# Patient Record
Sex: Male | Born: 1985 | Race: Black or African American | Hispanic: No | Marital: Single | Smoking: Current every day smoker
Health system: Southern US, Community
[De-identification: ages and names within clinical notes are randomized; demographics above are authoritative.]

## PROBLEM LIST (undated history)

## (undated) HISTORY — PX: FRACTURE SURGERY: SHX138

---

## 2011-08-13 ENCOUNTER — Encounter (HOSPITAL_BASED_OUTPATIENT_CLINIC_OR_DEPARTMENT_OTHER): Payer: Self-pay | Admitting: *Deleted

## 2011-08-13 ENCOUNTER — Emergency Department (HOSPITAL_BASED_OUTPATIENT_CLINIC_OR_DEPARTMENT_OTHER)
Admission: EM | Admit: 2011-08-13 | Discharge: 2011-08-13 | Disposition: A | Discharge status: Home / Self Care | Payer: Self-pay | Attending: Emergency Medicine | Admitting: Emergency Medicine

## 2011-08-13 DIAGNOSIS — A630 Anogenital (venereal) warts: Secondary | ICD-10-CM | POA: Insufficient documentation

## 2011-08-13 DIAGNOSIS — A510 Primary genital syphilis: Secondary | ICD-10-CM | POA: Insufficient documentation

## 2011-08-13 DIAGNOSIS — F17200 Nicotine dependence, unspecified, uncomplicated: Secondary | ICD-10-CM | POA: Insufficient documentation

## 2011-08-13 MED ORDER — CEFTRIAXONE SODIUM 250 MG IJ SOLR
250.0000 mg | Freq: Once | INTRAMUSCULAR | Status: AC
  Administered 2011-08-13: 250 mg via INTRAMUSCULAR
  Filled 2011-08-13: qty 250

## 2011-08-13 MED ORDER — AZITHROMYCIN 250 MG PO TABS
1000.0000 mg | ORAL_TABLET | Freq: Once | ORAL | Status: AC
  Administered 2011-08-13: 1000 mg via ORAL
  Filled 2011-08-13: qty 1
  Filled 2011-08-13: qty 4

## 2011-08-13 MED ORDER — LIDOCAINE HCL (PF) 1 % IJ SOLN
INTRAMUSCULAR | Status: AC
  Administered 2011-08-13: 5 mL
  Filled 2011-08-13: qty 5

## 2011-08-13 MED ORDER — PENICILLIN G BENZATHINE 1200000 UNIT/2ML IM SUSP
2.4000 10*6.[IU] | Freq: Once | INTRAMUSCULAR | Status: AC
  Administered 2011-08-13: 2.4 10*6.[IU] via INTRAMUSCULAR
  Filled 2011-08-13 (×2): qty 2

## 2011-08-13 MED ORDER — METRONIDAZOLE 500 MG PO TABS
2000.0000 mg | ORAL_TABLET | Freq: Once | ORAL | Status: AC
  Administered 2011-08-13: 2000 mg via ORAL
  Filled 2011-08-13: qty 4

## 2011-08-13 NOTE — ED Notes (Signed)
Pt states he has a ? Open cut, discharge and itching to the penis x a couple days

## 2011-08-13 NOTE — ED Provider Notes (Signed)
History  This chart was scribed for Troy Numbers, MD by Cherlynn Perches. The patient was seen in room MH06/MH06. Patient's care was started at 1513.  CSN: [id number]  Arrival date & time 08/13/11  1513   First MD Initiated Contact with Patient 08/13/11 1551      Chief Complaint  Patient presents with  . Penile Discharge    (Consider location/radiation/quality/duration/timing/severity/associated sxs/prior treatment) HPI  Troy Pineda is a 26 y.o. male who presents to the Emergency Department complaining of 2-3 days gradually worsening, constant, moderate genital sores characterized by a "cut" and "a few bumps" on his penis with associated discharge from sores and itching around the bumps. Pt states that he was tested for STDs a week and a half ago, but has not received the results yet. Pt denies having unprotected sex since being tested. Pt denies nausea, vomiting, fever, dysuria, and penile pain. Pt is a current everyday smoker and reports alcohol use.   History reviewed. No pertinent past medical history.  Past Surgical History  Procedure Date  . Fracture surgery     History reviewed. No pertinent family history.  History  Substance Use Topics  . Smoking status: Current Everyday Smoker  . Smokeless tobacco: Not on file  . Alcohol Use: Yes      Review of Systems  Constitutional: Negative for fever and chills.  HENT: Negative for ear pain and neck pain.   Respiratory: Negative for cough and shortness of breath.   Cardiovascular: Negative for chest pain.  Gastrointestinal: Negative for nausea, vomiting, diarrhea and constipation.  Genitourinary: Positive for discharge and genital sores. Negative for dysuria, hematuria and penile pain.  Neurological: Negative for tremors and headaches.  All other systems reviewed and are negative.    Allergies  Review of patient's allergies indicates not on file.  Home Medications  No current outpatient prescriptions on  file.  Triage Vitals: BP 119/64  Pulse 72  Temp(Src) 98.1 F (36.7 C) (Oral)  Resp 16  Ht 5\' 10"  (1.778 m)  Wt 155 lb (70.308 kg)  BMI 22.24 kg/m2  SpO2 100%  Physical Exam  Nursing note and vitals reviewed. Constitutional: He is oriented to person, place, and time. He appears well-developed and well-nourished.  HENT:  Head: Normocephalic and atraumatic.  Eyes: Conjunctivae are normal. No scleral icterus.  Neck: Normal range of motion. Neck supple.  Cardiovascular: Normal rate and regular rhythm.   Pulmonary/Chest: Effort normal and breath sounds normal.  Abdominal: Soft. There is no tenderness.  Genitourinary:       Patient is a circumcised male. He has numerous lesions consistent with genital warts noted over the dorsum of the penis as well as over the right scrotum. Patient also has an area at the 10:00 position at the base of the penis that is a centimeter in diameter, also rated, and relatively painless considering appearance. This is very concerning for a possible chancre there are also a few small and possibly vesicular lesions over the dorsum of the penis but these are nontender.  Musculoskeletal: Normal range of motion. He exhibits no edema.  Neurological: He is alert and oriented to person, place, and time.  Skin: Skin is warm and dry.  Psychiatric: He has a normal mood and affect. His behavior is normal.    ED Course  Procedures (including critical care time)  DIAGNOSTIC STUDIES: Oxygen Saturation is 100% on room air, normal by my interpretation.    COORDINATION OF CARE: 4:10PM - Will prescribe Rocephin, Flagyl, and send  for RPR. Pt agrees with plan. 5:50PM - Will discharge pt with prescriptions for rocephin, azithromycin, flagyl, bicillin 2.4 million units for early syphilis. RPR sent. Pt advised to follow up on STD results from the health department. Pt agrees with plan.     Labs Reviewed  RPR   No results found.   1. Chancre, syphilitic   2. Genital  warts       MDM  Patient was evaluated by myself. Based on presentation patient and I discussed that I was very concerned about him having numerous STDs including syphilis as well as genital warts. Patient was just tested at health department a week ago and has not yet received his testing results. Patient denies any penile discharge. He does have lesions that are very concerning for syphilis. Given this patient was treated him. He was Rocephin, azithromycin, and Flagyl for gonorrhea, Chlamydia, and Trichomonas. An RPR was sent to the patient reports that he had swabs of his mouth as well as blood testing performed. He did not have the lesions that he has today when he presented at that time. Patient was also treated with bicillin 2.4 million units IM.  He was told he should follow-up with an STD clinic or a regular doctor for possible cryotherapy for his lesions concerning for genital warts. The patient had a few very small vesicular appearing lesions these were nontender. Patient was given information for followup with infectious disease clinic as well as the STD clinic. Patient was what he was treated for today. He was advised to notify all his partners and to behave is infected with sexual transmitted disease. No additional testing was performed today as patient still has testing pending in another location.     I personally performed the services described in this documentation, which was scribed in my presence. The recorded information has been reviewed and considered.      Troy Numbers, MD 08/13/11 [phone]

## 2011-08-13 NOTE — Discharge Instructions (Signed)
You were treated for gonorrhea, chlamydia, and trichomonas as well as primary syphillis today.  You were tested again for syphillis.  Please notify your sexual partners and follow-up with a regular doctor regarding your genital warts. Syphilis, Females and Males Syphilis is an infection. Syphilis can be treated with medicines that kill germs (antibiotics). It is necessary that all your sexual partners also be tested for infection and possibly be treated. CAUSES  Syphilis is caused by a germ (bacteria) called Treponema pallidum. This infection is most commonly spread by sexual contact. The bacteria from an infected person is passed to another person through minor cuts or scrapes in the skin or mucous membranes. This infection may also be passed to a fetus through the blood of the pregnant and infected mother. This is very serious. It can cause deformities and may be life-threatening to the newborn. SYMPTOMS  Syphilis has 3 symptom stages:  Primary syphilis. The first stage involves painless sores (chancres) in and around the genital organs and mouth. Lymph nodes near the sores may also be swollen. Both the sores and swollen lymph nodes can disappear even if you are not treated. However, you will still have the infection in your body and can pass it to another person.   Secondary syphilis. The second stage involves a rash or sores over any portion of the body, including the palms of the hands and soles of the feet. Other symptoms commonly occur, including fever, new sores in the mouth or on the genitals, feeling generally ill, and having pain in the joints. These symptoms can also disappear with no treatment. However, you will still have the infection and can pass it to another person.   Tertiary syphilis. The third stage of this illness involves major and severe damage to different organs in the body. This includes damage to the brain leading to dementia and damage to the spinal cord leading to difficulty  walking. It also includes damage to the heart and aorta. This may cause heart failure, fainting, or development of an enlargement (aneurysm) of the aorta.  At any time after infection, it is possible to have no symptoms or signs of this disease. This is called latent syphilis. The only way to prove the presence of latent syphilis is by testing your blood for the infection. DIAGNOSIS  Depending on the stage of illness, an exam may show nothing or it may show the various symptoms associated with each stage.  During all stages of this infection, blood tests are used to confirm the diagnosis.   In the first and second stages, a fluid (drainage) sample from a sore or rash can be examined under a microscope to detect the bacteria.   Sometimes, fluid around the spine needs to be examined to detect brain damage or inflammation of the brain lining (meningitis).   In the third stage, X-rays, computerized X-ray scans (CT or CAT scans), computerized magnetic scans (MRIs), or ultrasounds may also be done to detect disease of the heart, aorta, or brain.  TREATMENT   Antibiotics are used to treat all stages of syphilis. During the first day of your treatment you may experience fever, chills, headache, nausea, or aching all over your body. This is a normal reaction to the medicine.   Syphilis can be cured if the diagnosis is made early and the correct antibiotic is given.   Notify your recent sexual partners so they will go for testing as well as possible treatment.   Do not engage in sexual  activity until your caregiver tells you the infection has been cured.   Testing and treatment for other sexually transmitted diseases (STDs) may be done when you are diagnosed with syphilis. Syphilis is an STD. You are also at risk for other STDs, which are often transmitted around the same time as syphilis. These include:   Chlamydia.   Gonorrhea.   Trichomonas.   Human papilloma virus (HPV).   Human  immunodeficiency virus (HIV).   If you are pregnant, have syphilis, and are not treated, it can cause deformities and can be life-threatening to the baby.  HOME CARE INSTRUCTIONS   Finish all medicine as prescribed. Incomplete treatment will put you at risk for continued infection and could be life-threatening.   Only take over-the-counter or prescription medicines for pain, discomfort, or fever as directed by your caregiver.   Do not have sex until treatment is completed, or as instructed by your caregiver.   Follow up with your caregiver as directed.   If your diagnosis of syphilis is confirmed, inform your recent sexual partners. They need to seek care and treatment, even if they have no symptoms.  Finding out the results of your test Not all test results are available during your visit. If your test results are not back during the visit, make an appointment with your caregiver to find out the results. Do not assume everything is normal if you have not heard from your caregiver or the medical facility. It is important for you to follow up on all of your test results. SEEK MEDICAL CARE IF:   You develop any bad reaction to the medicine you were prescribed. This may include:   An oral temperature above 102 F (38.9 C).   Chills.   Headache.   Feeling lightheaded.   A new rash.  MAKE SURE YOU:   Understand these instructions.   Will watch your condition.   Will get help right away if you are not doing well or get worse.  Document Released: 08/17/2000 Document Revised: 02/10/2011 Document Reviewed: 06/24/2009 [facility] Patient Information 2012 [place], [place].Genital Warts Genital warts are a sexually transmitted infection. They may appear as small bumps on the tissues of the genital area. CAUSES  Genital warts are caused by a virus called human papillomavirus (HPV). HPV is the most common sexually transmitted disease (STD) and infection of the sex organs. This infection is  spread by having unprotected sex with an infected person. It can be spread by vaginal, anal, and oral sex. Many people do not know they are infected. They may be infected for years without problems. However, even if they do not have problems, they can unknowingly pass the infection to their sexual partners. SYMPTOMS   Itching and irritation in the genital area.   Warts that bleed.   Painful sexual intercourse.  DIAGNOSIS  Warts are usually recognized with the naked eye on the vagina, vulva, perineum, anus, and rectum. Certain tests can also diagnose genital warts, such as:  A Pap test.   A tissue sample (biopsy) exam.   Colposcopy. A magnifying tool is used to examine the vagina and cervix. The HPV cells will change color when certain solutions are used.  TREATMENT  Warts can be removed by:  Applying certain chemicals, such as cantharidin or podophyllin.   Liquid nitrogen freezing (cryotherapy).   Immunotherapy with candida or trichophyton injections.   Laser treatment.   Burning with an electrified probe (electrocautery).   Interferon injections.   Surgery.  PREVENTION  HPV vaccination can help prevent HPV infections that cause genital warts and that cause cancer of the cervix. It is recommended that the vaccination be given to people between the ages 41 to 72 years old. The vaccine might not work as well or might not work at all if you already have HPV. It should not be given to pregnant women. HOME CARE INSTRUCTIONS   It is important to follow your caregiver's instructions. The warts will not go away without treatment. Repeat treatments are often needed to get rid of warts. Even after it appears that the warts are gone, the normal tissue underneath often remains infected.   Do not try to treat genital warts with medicine used to treat hand warts. This type of medicine is strong and can burn the skin in the genital area, causing more damage.   Tell your past and current  sexual partner(s) that you have genital warts. They may be infected also and need treatment.   Avoid sexual contact while being treated.   Do not touch or scratch the warts. The infection may spread to other parts of your body.   Women with genital warts should have a cervical cancer check (Pap test) at least once a year. This type of cancer is slow-growing and can be cured if found early. Chances of developing cervical cancer are increased with HPV.   Inform your obstetrician about your warts in the event of pregnancy. This virus can be passed to the baby's respiratory tract. Discuss this with your caregiver.   Use a condom during sexual intercourse. Following treatment, the use of condoms will help prevent reinfection.   Ask your caregiver about using over-the-counter anti-itch creams.  SEEK MEDICAL CARE IF:   Your treated skin becomes red, swollen, or painful.   You have a fever.   You feel generally ill.   You feel little lumps in and around your genital area.   You are bleeding or have painful sexual intercourse.  MAKE SURE YOU:   Understand these instructions.   Will watch your condition.   Will get help right away if you are not doing well or get worse.  Document Released: 02/19/2000 Document Revised: 02/10/2011 Document Reviewed: 08/30/2010 [facility] Patient Information 2012 [place], [place].  RESOURCE GUIDE  Chronic Pain Problems: Contact [facility]  [phone] Patients need to be referred by their primary care doctor.  Insufficient Money for Medicine: Contact United Way:  call "211" or [facility] [phone].  No Primary Care Doctor: - Call [facility]  [phone] - can help you locate a primary care doctor that  accepts your insurance, provides certain services, etc. - Physician Referral [phone]  Agencies that provide inexpensive medical care: - [facility]  [phone] - [facility]   [phone] - [facility]  [phone] [facility]  [phone] - Planned Parenthood  [phone] [facility]  [phone]  Medicaid-accepting [facility] Providers: - [facility]- [address]  [phone], Mon-Fri 9am-7pm, Sat 9am-1pm - [facility]- [address] [place], [address] [place]  [phone] - [facility]- [address] [place]  [phone] [facility]- [address]  [phone] - Renaye Rakers- [address] [place], [address], [phone]  Only accepts [place] patients after they have their name  applied to their card  Self Pay (no insurance) in [place]: - Sickle Cell Patients: Dr Willey Blade, [facility]  [address] [place], [phone] - [facility]- [address] [place]  [phone]       Patrcia Dolly [facility] [place]- [address]       -     [facility]- see information above (Speak to [employer] if you do not have insurance)       -  [facility]- [address] [place], [phone]       -  [facility] [place]- [address],  [phone]       -  [facility]- [address] [phone]       -  Dr Julio Sicks-  [address], [place], [phone]       -  [facility]- [address], [phone]       -  [facility]- [address], [phone], also [address] [phone]       -    [facility]- [address] [place], [phone], 1st & 3rd Saturday   every month, 10am-1pm  1) Find a Doctor and Pay Out of Pocket Although you won't have to find out who is covered by your insurance plan, it is a good idea to ask around and get recommendations. You will then need to call the office and see if the doctor you have chosen will accept you as a new patient and what types of options they offer for patients who are self-pay.  Some doctors offer discounts or will set up payment plans for their patients who do not have insurance, but you will need to ask so you aren't surprised when you get to your appointment.  2) Contact Your Local Health Department Not all health departments have doctors that can see patients for sick visits, but many do, so it is worth a call to see if yours does. If you don't know where your local health department is, you can check in your phone book. The CDC also has a tool to help you locate your state's health department, and many state websites also have listings of all of their local health departments.  3) Find a Walk-in Clinic If your illness is not likely to be very severe or complicated, you may want to try a walk in clinic. These are popping up all over the country in pharmacies, drugstores, and shopping centers. They're usually staffed by nurse practitioners or physician assistants that have been trained to treat common illnesses and complaints. They're usually fairly quick and inexpensive. However, if you have serious medical issues or chronic medical problems, these are probably not your best option  STD Testing - [facility] Department of [facility], STD Clinic, [address] [place], phone [phone] or [phone].  Monday - Friday, call for an appointment. [facility] Department of [employer], STD Clinic, [address], [place], phone [phone] or [phone].  Monday - Friday, call for an appointment.  Abuse/Neglect: [facility] Child Abuse Hotline 805-683-3396 [facility] Child Abuse Hotline 8575552691 (After Hours)  Emergency Shelter:  [facility] 530-690-0404  Maternity Homes: - Room at the [place] [facility] 715-861-9858 - [facility] 9090288591  MRSA Hotline #:   (670)851-6887  [facility]  [place]  [employer] [facility] Dept. [address]                 [address]         [address] [place] [address]  Blondell Reveal Phone:  [phone]                                  Phone:  [phone]                   Phone:  [phone]  [facility] Mental Health, [phone] - [phone]       -     [facility] in [place], [address]                                  [phone], [facility] Child Abuse Hotline 714-277-6070 or 579-180-4261 (After Hours)   Behavioral Health Services  Substance Abuse Resources: - Alcohol and Drug Services  (254) 084-2526 - [facility] (585)686-5497 - [facility] [place] [phone] Floydene Flock (858)276-2498 - Residential & Outpatient Substance Abuse Program  (571)262-6776  Psychological Services: [facility]  2516757240 Services  [phone] - [facility], ([phone] [place]. [address], [place], ACCESS LINE: 814-632-2554 or 5874409537, [employer]za  Dental Assistance  If unable to pay or uninsured, contact:  [facility] or [facility] to become qualified for the adult dental clinic.  Patients with Medicaid: [facility] [phone] [address], [phone] [address], [phone]  If unable to pay, or uninsured, contact [facility] [phone]) or [facility] Department [phone] in [place], [phone] in [facility]) to become qualified for the adult dental clinic  Other Low-Cost Community Dental Services: - [facility]- [address] [place], [place], [place], [address], [phone], [address]. [phone], 2nd and 4th Thursday of the month at 6:30am.  10 clients each day by appointment, can sometimes see walk-in patients if someone does not show for  an appointment. [facility]- [address] [place], [place], [address], [phone] - [facility]- [address], [place], [place], [address], [phone] - [facility]- [phone] [facility] Health Department- [phone] [facility] [phone] -

## 2011-08-14 LAB — RPR: RPR Ser Ql: NONREACTIVE

## 2014-05-16 ENCOUNTER — Emergency Department (HOSPITAL_BASED_OUTPATIENT_CLINIC_OR_DEPARTMENT_OTHER)
Admission: EM | Admit: 2014-05-16 | Discharge: 2014-05-16 | Disposition: A | Discharge status: Home / Self Care | Payer: Self-pay | Attending: Emergency Medicine | Admitting: Emergency Medicine

## 2014-05-16 ENCOUNTER — Encounter (HOSPITAL_BASED_OUTPATIENT_CLINIC_OR_DEPARTMENT_OTHER): Payer: Self-pay | Admitting: *Deleted

## 2014-05-16 DIAGNOSIS — R69 Illness, unspecified: Secondary | ICD-10-CM

## 2014-05-16 DIAGNOSIS — Z7982 Long term (current) use of aspirin: Secondary | ICD-10-CM | POA: Insufficient documentation

## 2014-05-16 DIAGNOSIS — Z720 Tobacco use: Secondary | ICD-10-CM | POA: Insufficient documentation

## 2014-05-16 DIAGNOSIS — J111 Influenza due to unidentified influenza virus with other respiratory manifestations: Secondary | ICD-10-CM | POA: Insufficient documentation

## 2014-05-16 MED ORDER — OSELTAMIVIR PHOSPHATE 75 MG PO CAPS: 75.0000 mg | ORAL_CAPSULE | Freq: Two times a day (BID) | ORAL | Status: AC

## 2014-05-16 MED ORDER — ACETAMINOPHEN 325 MG PO TABS
650.0000 mg | ORAL_TABLET | Freq: Once | ORAL | Status: AC
  Administered 2014-05-16: 650 mg via ORAL
  Filled 2014-05-16: qty 2

## 2014-05-16 NOTE — ED Notes (Signed)
symptom began last night. Daughter diagnosed with flu this am at PCP.

## 2014-05-16 NOTE — Discharge Instructions (Signed)

## 2014-05-16 NOTE — ED Provider Notes (Signed)
CSN: [id number]     Arrival date & time 05/16/14  1008 History   None    Chief Complaint  Patient presents with  . Influenza    daughter diagnosed today     (Consider location/radiation/quality/duration/timing/severity/associated sxs/prior Treatment) HPI 29 year old male with cough fever headache and body aches for several days. His daughter was diagnosed with flu this morning at the primary care physician. Using over-the-counter simply medicine with minimal relief. His had some diarrhea but no vomiting. Taking by mouth fluids without difficulty. He has no significant past medical history. He has had some cough productive of sputum but is not dyspneic. History reviewed. No pertinent past medical history. Past Surgical History  Procedure Laterality Date  . Fracture surgery     No family history on file. History  Substance Use Topics  . Smoking status: Current Every Day Smoker  . Smokeless tobacco: Not on file  . Alcohol Use: Yes    Review of Systems  All other systems reviewed and are negative.     Allergies  Review of patient's allergies indicates no known allergies.  Home Medications   Prior to Admission medications   Medication Sig Start Date End Date Taking? Authorizing Provider  aspirin-sod bicarb-citric acid (ALKA-SELTZER) 325 MG TBEF tablet Take 325 mg by mouth every 6 (six) hours as needed.   Yes Historical Provider, MD   BP 106/76 mmHg  Pulse 80  Temp(Src) 99.9 F (37.7 C) (Oral)  Resp 18  Wt 155 lb (70.308 kg)  SpO2 98% Physical Exam  Constitutional: He is oriented to person, place, and time. He appears well-developed and well-nourished.  HENT:  Head: Normocephalic and atraumatic.  Right Ear: External ear normal.  Left Ear: External ear normal.  Nose: Nose normal.  Mouth/Throat: Oropharynx is clear and moist.  Eyes: Conjunctivae and EOM are normal. Pupils are equal, round, and reactive to light.  Neck: Normal range of motion. Neck supple.   Cardiovascular: Normal rate, regular rhythm, normal heart sounds and intact distal pulses.   Pulmonary/Chest: Effort normal and breath sounds normal. No respiratory distress. He has no wheezes. He exhibits no tenderness.  Abdominal: Soft. Bowel sounds are normal. He exhibits no distension and no mass. There is no tenderness. There is no guarding.  Musculoskeletal: Normal range of motion.  Neurological: He is alert and oriented to person, place, and time. He has normal reflexes. He exhibits normal muscle tone. Coordination normal.  Skin: Skin is warm and dry.  Psychiatric: He has a normal mood and affect. His behavior is normal. Judgment and thought content normal.  Nursing note and vitals reviewed.   ED Course  Procedures (including critical care time) Labs Review Labs Reviewed - No data to display  Imaging Review No results found.   EKG Interpretation None      MDM   Final diagnoses:  Influenza-like illness        Margarita Grizzleanielle Davelyn Gwinn, MD 05/22/14 [phone]

## 2016-05-30 ENCOUNTER — Emergency Department (HOSPITAL_BASED_OUTPATIENT_CLINIC_OR_DEPARTMENT_OTHER)
Admission: EM | Admit: 2016-05-30 | Discharge: 2016-05-30 | Disposition: A | Discharge status: Home / Self Care | Payer: Self-pay | Attending: Emergency Medicine | Admitting: Emergency Medicine

## 2016-05-30 ENCOUNTER — Encounter (HOSPITAL_BASED_OUTPATIENT_CLINIC_OR_DEPARTMENT_OTHER): Payer: Self-pay

## 2016-05-30 DIAGNOSIS — F17200 Nicotine dependence, unspecified, uncomplicated: Secondary | ICD-10-CM | POA: Insufficient documentation

## 2016-05-30 DIAGNOSIS — H9201 Otalgia, right ear: Secondary | ICD-10-CM | POA: Insufficient documentation

## 2016-05-30 NOTE — ED Provider Notes (Signed)
[facility] Provider Note   CSN: [id number] Arrival date & time: 05/30/16  1539  By signing my name below, I, Teofilo Pod, attest that this documentation has been prepared under the direction and in the presence of Benjiman Core, MD . Electronically Signed: Teofilo Pod, ED Scribe. 05/30/2016. 3:52 PM.    History   Chief Complaint Chief Complaint  Patient presents with  . Otalgia   The history is provided by the patient. No language interpreter was used.   HPI Comments:  Troy Pineda is a 31 y.o. male who presents to the Emergency Department complaining of intermittent right ear pain x 2 days. Pt states that the pain lasts for 2 hour episodes. Pt is a smoker. No alleviating factors noted. Pt denies hearing loss, fever, chills, rhinorrhea.     History reviewed. No pertinent past medical history.  There are no active problems to display for this patient.   Past Surgical History:  Procedure Laterality Date  . FRACTURE SURGERY         Home Medications    Prior to Admission medications   Medication Sig Start Date End Date Taking? Authorizing Provider  aspirin-sod bicarb-citric acid (ALKA-SELTZER) 325 MG TBEF tablet Take 325 mg by mouth every 6 (six) hours as needed.    Historical Provider, MD  oseltamivir (TAMIFLU) 75 MG capsule Take 1 capsule (75 mg total) by mouth every 12 (twelve) hours. 05/16/14   Margarita Grizzle, MD    Family History No family history on file.  Social History Social History  Substance Use Topics  . Smoking status: Current Every Day Smoker  . Smokeless tobacco: Not on file  . Alcohol use Yes     Allergies   Patient has no known allergies.   Review of Systems Review of Systems  Constitutional: Negative for chills and fever.  HENT: Positive for ear pain. Negative for hearing loss and rhinorrhea.      Physical Exam Updated Vital Signs BP 122/75 (BP Location: Left Arm)   Pulse 77   Temp 98.1 F (36.7 C) (Oral)    Resp 18   Ht 5\' 10"  (1.778 m)   Wt 140 lb (63.5 kg)   SpO2 100%   BMI 20.09 kg/m   Physical Exam  Constitutional: He appears well-developed and well-nourished. No distress.  HENT:  Head: Normocephalic and atraumatic.  Left Ear: External ear normal.  Right ear. 1cm firm lymph node of mastoid. No pain with movement of ear. Right TM normal. Inferior and posteriorly on internal canal there was a scab that I removed, no blood, erythema, purulence.   Eyes: EOM are normal. Pupils are equal, round, and reactive to light.  Cardiovascular: Normal rate.   Pulmonary/Chest: Effort normal.  Abdominal: He exhibits no distension.  Neurological: He is alert.  Skin: Skin is warm and dry.  Nursing note and vitals reviewed.    ED Treatments / Results  Labs (all labs ordered are listed, but only abnormal results are displayed) Labs Reviewed - No data to display  EKG  EKG Interpretation None       Radiology No results found.  Procedures Procedures (including critical care time)  Medications Ordered in ED Medications - No data to display   Initial Impression / Assessment and Plan / ED Course  I have reviewed the triage vital signs and the nursing notes.  Pertinent labs & imaging results that were available during my care of the patient were reviewed by me and considered in my  medical decision making (see chart for details).     Patient with right-sided ear pain. Had scab inside that when removed did not have any purulence or bleeding. Likely cause of the pain. Will need to follow-up for the OCD or nodule. Will discharge home.  Final Clinical Impressions(s) / ED Diagnoses   Final diagnoses:  Ear pain, right    New Prescriptions Discharge Medication List as of 05/30/2016  3:56 PM    I personally performed the services described in this documentation, which was scribed in my presence. The recorded information has been reviewed and is accurate.       Benjiman CoreNathan Rose-Marie Hickling,  MD 05/31/16 0005

## 2016-05-30 NOTE — Discharge Instructions (Signed)
It appears as if there been a small cut or injury to the inside of the ear. The eardrum seems fine. Watch for signs of infection. Take Motrin Tylenol as needed for the pain. Follow-up as needed to make sure the ear heals and the lymph node behind the ear resolves.

## 2016-05-30 NOTE — ED Notes (Signed)
ED Provider at bedside. 

## 2016-05-30 NOTE — ED Triage Notes (Signed)
Pt c/o right ear pain for the last several days, took some tylenol last night without relief

## 2017-07-22 ENCOUNTER — Other Ambulatory Visit: Payer: Self-pay

## 2017-07-22 ENCOUNTER — Emergency Department (HOSPITAL_BASED_OUTPATIENT_CLINIC_OR_DEPARTMENT_OTHER)
Admission: EM | Admit: 2017-07-22 | Discharge: 2017-07-22 | Disposition: A | Discharge status: Home / Self Care | Payer: Self-pay | Attending: Emergency Medicine | Admitting: Emergency Medicine

## 2017-07-22 ENCOUNTER — Encounter (HOSPITAL_BASED_OUTPATIENT_CLINIC_OR_DEPARTMENT_OTHER): Payer: Self-pay | Admitting: Emergency Medicine

## 2017-07-22 DIAGNOSIS — F17200 Nicotine dependence, unspecified, uncomplicated: Secondary | ICD-10-CM | POA: Insufficient documentation

## 2017-07-22 DIAGNOSIS — R21 Rash and other nonspecific skin eruption: Secondary | ICD-10-CM | POA: Insufficient documentation

## 2017-07-22 DIAGNOSIS — J029 Acute pharyngitis, unspecified: Secondary | ICD-10-CM

## 2017-07-22 LAB — RAPID STREP SCREEN (MED CTR MEBANE ONLY): Streptococcus, Group A Screen (Direct): NEGATIVE

## 2017-07-22 MED ORDER — LIDOCAINE VISCOUS HCL 2 % MT SOLN
15.0000 mL | OROMUCOSAL | 0 refills | Status: AC | PRN
Start: 2017-07-22 — End: ?

## 2017-07-22 MED ORDER — FAMOTIDINE 20 MG PO TABS
40.0000 mg | ORAL_TABLET | Freq: Once | ORAL | Status: AC
  Administered 2017-07-22: 40 mg via ORAL
  Filled 2017-07-22: qty 2

## 2017-07-22 MED ORDER — DEXAMETHASONE SODIUM PHOSPHATE 10 MG/ML IJ SOLN
10.0000 mg | Freq: Once | INTRAMUSCULAR | Status: AC
  Administered 2017-07-22: 10 mg via INTRAMUSCULAR
  Filled 2017-07-22: qty 1

## 2017-07-22 MED ORDER — DIPHENHYDRAMINE HCL 25 MG PO CAPS
50.0000 mg | ORAL_CAPSULE | Freq: Once | ORAL | Status: AC
  Administered 2017-07-22: 50 mg via ORAL
  Filled 2017-07-22: qty 2

## 2017-07-22 NOTE — Discharge Instructions (Addendum)
Your strep test was negative.  This will be cultured and if is positive will be notified.  In terms of the rash your symptoms may be related to allergic reaction.  I would recommend taking Benadryl as needed.  May take Pepcid daily.  Have given you steroids in the ED.  Take Motrin and Tylenol for any sore throat.  Have given you a lidocaine rinse to help numb your throat.  If you have any difficulties breathing or swallowing, worsening rash or wheezing in your lungs return the ED immediately.

## 2017-07-22 NOTE — ED Notes (Signed)
Given  popsicle

## 2017-07-22 NOTE — ED Triage Notes (Signed)
Sore throat x 2 days but states he feels better today.

## 2017-07-23 NOTE — ED Provider Notes (Signed)
[facility] EMERGENCY DEPARTMENT Provider Note   CSN: [id number] Arrival date & time: 07/22/17  1103     History   Chief Complaint Chief Complaint  Patient presents with  . Sore Throat    HPI Troy Pineda is a 32 y.o. male.  HPI 32 year old African-American male with no pertinent past medical history presents to the ED for evaluation of sore throat.  Onset 2 days ago.  Reports pain with swallowing.  States that it is worse in the morning when he wakes up.  Feels slightly improved today.  Reports a mild cough and some postnasal drip.  Denies any known sick contacts.  Has not tried any over-the-counter medications for his symptoms.  Patient also states that he noticed a rash to his bilateral upper arms, back and abdomen today.  Pruritic in nature.  Has taken no medications.  Denies any new soaps, detergents or medications.  Denies any known fevers, otalgia, difficulty breathing or swallowing.  Denies any wheezing. History reviewed. No pertinent past medical history.  There are no active problems to display for this patient.   Past Surgical History:  Procedure Laterality Date  . FRACTURE SURGERY          Home Medications    Prior to Admission medications   Medication Sig Start Date End Date Taking? Authorizing Provider  aspirin-sod bicarb-citric acid (ALKA-SELTZER) 325 MG TBEF tablet Take 325 mg by mouth every 6 (six) hours as needed.    [provider]  lidocaine (XYLOCAINE) 2 % solution Use as directed 15 mLs in the mouth or throat as needed for mouth pain. 07/22/17   Rise Mu, PA-C  oseltamivir (TAMIFLU) 75 MG capsule Take 1 capsule (75 mg total) by mouth every 12 (twelve) hours. 05/16/14   Margarita Grizzle, MD    Family History No family history on file.  Social History Social History   Tobacco Use  . Smoking status: Current Every Day Smoker  . Smokeless tobacco: Never Used  Substance Use Topics  . Alcohol use: Yes  . Drug use: Yes   Types: Marijuana     Allergies   Patient has no known allergies.   Review of Systems Review of Systems  Constitutional: Negative for chills and fever.  HENT: Positive for postnasal drip and sore throat. Negative for ear pain.   Respiratory: Negative for shortness of breath and wheezing.   Cardiovascular: Negative for chest pain.  Gastrointestinal: Negative for nausea and vomiting.  Skin: Positive for rash.  Neurological: Negative for dizziness and light-headedness.     Physical Exam Updated Vital Signs BP 113/74 (BP Location: Left Arm)   Pulse 67   Temp 98.1 F (36.7 C) (Oral)   Resp 16   Ht  (1.778 m)   Wt 63.5 kg (140 lb)   SpO2 100%   BMI 20.09 kg/m   Physical Exam  Constitutional: He appears well-developed and well-nourished. No distress.  HENT:  Head: Normocephalic and atraumatic.  Right Ear: Tympanic membrane, external ear and ear canal normal.  Left Ear: Tympanic membrane, external ear and ear canal normal.  Nose: Mucosal edema present.  Mouth/Throat: Uvula is midline and mucous membranes are normal. No trismus in the jaw. Posterior oropharyngeal erythema present. No oropharyngeal exudate, posterior oropharyngeal edema or tonsillar abscesses. No tonsillar exudate.  Managing secretions and tolerating airway.   Eyes: Right eye exhibits no discharge. Left eye exhibits no discharge. No scleral icterus.  Neck: Normal range of motion. Neck supple.  Pulmonary/Chest: Effort  normal and breath sounds normal. No stridor. No respiratory distress. He has no wheezes. He has no rales. He exhibits no tenderness.  Musculoskeletal: Normal range of motion.  Lymphadenopathy:    He has no cervical adenopathy.  Neurological: He is alert.  Skin: Skin is warm and dry. Capillary refill takes less than 2 seconds. Rash noted. No pallor.  Urticarial time rash to bilateral arms, back and abd.  Psychiatric: His behavior is normal. Judgment and thought content normal.  Nursing note  and vitals reviewed.    ED Treatments / Results  Labs (all labs ordered are listed, but only abnormal results are displayed) Labs Reviewed  RAPID STREP SCREEN ([facility] ONLY)  CULTURE, GROUP A STREP [facility])    EKG None  Radiology No results found.  Procedures Procedures (including critical care time)  Medications Ordered in ED Medications  dexamethasone (DECADRON) injection 10 mg (10 mg Intramuscular Given 07/22/17 1214)  diphenhydrAMINE (BENADRYL) capsule 50 mg (50 mg Oral Given 07/22/17 1213)  famotidine (PEPCID) tablet 40 mg (40 mg Oral Given 07/22/17 1214)     Initial Impression / Assessment and Plan / ED Course  I have reviewed the triage vital signs and the nursing notes.  Pertinent labs & imaging results that were available during my care of the patient were reviewed by me and considered in my medical decision making (see chart for details).     Patient presents the ED for evaluation of sore throat for the past 2 days.  Reports urticarial pruritic rash today.  Denies any new soaps, detergents or medications.  No angioedema of the oropharynx.  No exudate noted.  Strep test was negative.  Vital signs reassuring.  Lungs clear to auscultation bilaterally.  Doubt anaphylaxis.  This likely a viral illness.  Patient given Decadron, Pepcid and Benadryl in the ED.  Discussed symptom Medicare at home with lidocaine rinse.  Follow-up with primary care as needed and discussed return precautions.  Pt is hemodynamically stable, in NAD, & able to ambulate in the ED. Evaluation does not show pathology that would require ongoing emergent intervention or inpatient treatment. I explained the diagnosis to the patient. Pain has been managed & has no complaints prior to dc. Pt is comfortable with above plan and is stable for discharge at this time. All questions were answered prior to disposition. Strict return precautions for f/u to the ED were discussed. Encouraged follow up with  PCP.   Final Clinical Impressions(s) / ED Diagnoses   Final diagnoses:  Rash  Sore throat    ED Discharge Orders        Ordered    lidocaine (XYLOCAINE) 2 % solution  As needed     07/22/17 1319       Wallace Keller 07/23/17 1403    Raeford Razor, MD 07/26/17 [phone]

## 2017-07-25 LAB — CULTURE, GROUP A STREP (THRC)

## 2020-09-30 ENCOUNTER — Emergency Department (HOSPITAL_BASED_OUTPATIENT_CLINIC_OR_DEPARTMENT_OTHER): Payer: No Typology Code available for payment source

## 2020-09-30 ENCOUNTER — Emergency Department (HOSPITAL_BASED_OUTPATIENT_CLINIC_OR_DEPARTMENT_OTHER)
Admission: EM | Admit: 2020-09-30 | Discharge: 2020-09-30 | Disposition: A | Discharge status: Home / Self Care | Payer: No Typology Code available for payment source | Attending: Emergency Medicine | Admitting: Emergency Medicine

## 2020-09-30 ENCOUNTER — Other Ambulatory Visit: Payer: Self-pay

## 2020-09-30 ENCOUNTER — Encounter (HOSPITAL_BASED_OUTPATIENT_CLINIC_OR_DEPARTMENT_OTHER): Payer: Self-pay

## 2020-09-30 DIAGNOSIS — Y92410 Unspecified street and highway as the place of occurrence of the external cause: Secondary | ICD-10-CM | POA: Diagnosis not present

## 2020-09-30 DIAGNOSIS — F17290 Nicotine dependence, other tobacco product, uncomplicated: Secondary | ICD-10-CM | POA: Insufficient documentation

## 2020-09-30 DIAGNOSIS — M542 Cervicalgia: Secondary | ICD-10-CM | POA: Insufficient documentation

## 2020-09-30 DIAGNOSIS — M549 Dorsalgia, unspecified: Secondary | ICD-10-CM | POA: Insufficient documentation

## 2020-09-30 MED ORDER — IBUPROFEN 800 MG PO TABS: 800.0000 mg | ORAL_TABLET | Freq: Three times a day (TID) | ORAL | 0 refills | Status: AC | PRN

## 2020-09-30 MED ORDER — CYCLOBENZAPRINE HCL 10 MG PO TABS: 10.0000 mg | ORAL_TABLET | Freq: Two times a day (BID) | ORAL | 0 refills | Status: AC | PRN

## 2020-09-30 MED ORDER — KETOROLAC TROMETHAMINE 15 MG/ML IJ SOLN
30.0000 mg | Freq: Once | INTRAMUSCULAR | Status: AC
  Administered 2020-09-30: 30 mg via INTRAMUSCULAR
  Filled 2020-09-30: qty 2
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
